# Patient Record
Sex: Female | Born: 2000 | Race: Black or African American | Hispanic: No | Marital: Single | Smoking: Never smoker
Health system: Southern US, Community
[De-identification: ages and names within clinical notes are randomized; demographics above are authoritative.]

## PROBLEM LIST (undated history)

## (undated) DIAGNOSIS — K5900 Constipation, unspecified: Secondary | ICD-10-CM

---
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
## 2019-08-31 ENCOUNTER — Encounter (HOSPITAL_BASED_OUTPATIENT_CLINIC_OR_DEPARTMENT_OTHER): Payer: Self-pay | Admitting: *Deleted

## 2019-08-31 ENCOUNTER — Emergency Department (HOSPITAL_BASED_OUTPATIENT_CLINIC_OR_DEPARTMENT_OTHER)
Admission: EM | Admit: 2019-08-31 | Discharge: 2019-08-31 | Disposition: A | Discharge status: Home / Self Care | Payer: Medicaid Other | Attending: Emergency Medicine | Admitting: Emergency Medicine

## 2019-08-31 ENCOUNTER — Other Ambulatory Visit: Payer: Self-pay

## 2019-08-31 DIAGNOSIS — N912 Amenorrhea, unspecified: Secondary | ICD-10-CM | POA: Insufficient documentation

## 2019-08-31 DIAGNOSIS — Z3201 Encounter for pregnancy test, result positive: Secondary | ICD-10-CM | POA: Diagnosis not present

## 2019-08-31 DIAGNOSIS — J45909 Unspecified asthma, uncomplicated: Secondary | ICD-10-CM | POA: Insufficient documentation

## 2019-08-31 DIAGNOSIS — Z3A01 Less than 8 weeks gestation of pregnancy: Secondary | ICD-10-CM | POA: Insufficient documentation

## 2019-08-31 DIAGNOSIS — Z3200 Encounter for pregnancy test, result unknown: Secondary | ICD-10-CM | POA: Diagnosis present

## 2019-08-31 LAB — PREGNANCY, URINE: Preg Test, Ur: POSITIVE — AB

## 2019-08-31 NOTE — ED Provider Notes (Signed)
[facility] EMERGENCY DEPARTMENT Provider Note   CSN: [id number] Arrival date & time: 08/31/19  1010     History Chief Complaint  Patient presents with  . Possible Pregnancy    Kari Mcguire is a 19 y.o. female with no relevant past medical history presents to the ED after having a positive at-home pregnancy test.  She reports that her period is 12 days late and she would like formal testing here in the ED.   Patient states that her last menses began 07/19/2019.  She endorses intermittent mild cramping, but she suspects that is attributed to "weight loss" tea that she had been drinking.  She recently discontinued that tea and states that her symptoms have improved.  She denies any fevers or chills, significant abdominal/pelvic pain, nausea or vomiting, vaginal bleeding or discharge, or other symptoms.  She is accompanied by her boyfriend.  She last saw her PCP at [facility] couple of months ago.  HPI     Past Medical History:  Diagnosis Date  . Asthma   . Constipation     There are no problems to display for this patient.   History reviewed. No pertinent surgical history.   OB History    Gravida  1   Para      Term      Preterm      AB      Living        SAB      TAB      Ectopic      Multiple      Live Births              No family history on file.  Social History   Tobacco Use  . Smoking status: Never Smoker  . Smokeless tobacco: Never Used  Substance Use Topics  . Alcohol use: No  . Drug use: No    Home Medications Prior to Admission medications   Medication Sig Start Date End Date Taking? Authorizing Provider  cephALEXin (KEFLEX) 500 MG capsule Take 1 capsule (500 mg total) by mouth 3 (three) times daily. 04/25/17   Horton, Mayer Masker, MD  mineral oil-hydrophilic petrolatum (AQUAPHOR) ointment Apply topically as needed. 04/25/17   Horton, Mayer Masker, MD    Allergies    Patient has no known allergies.  Review of  Systems   Review of Systems  Constitutional: Negative for fever.  Gastrointestinal: Negative for abdominal pain and vomiting.  Genitourinary: Positive for menstrual problem. Negative for dyspareunia, pelvic pain and vaginal bleeding.    Physical Exam Updated Vital Signs BP 102/78 (BP Location: Left Arm)   Pulse 95   Temp 99.6 F (37.6 C) (Oral)   Resp 18   Ht 5' (1.524 m)   Wt 79 kg   LMP 08/20/2019 (Approximate)   SpO2 100%   BMI 34.03 kg/m   Physical Exam Vitals and nursing note reviewed. Exam conducted with a chaperone present.  Constitutional:      General: She is not in acute distress.    Appearance: Normal appearance. She is not ill-appearing.  HENT:     Head: Normocephalic and atraumatic.  Eyes:     General: No scleral icterus.    Conjunctiva/sclera: Conjunctivae normal.  Cardiovascular:     Rate and Rhythm: Normal rate.     Pulses: Normal pulses.     Heart sounds: Normal heart sounds.  Pulmonary:     Effort: Pulmonary effort is normal. No respiratory distress.  Breath sounds: Normal breath sounds.  Abdominal:     Comments: Soft, nondistended.  No areas of TTP.  No guarding.  No overlying skin changes.  Musculoskeletal:     Cervical back: Normal range of motion.  Skin:    General: Skin is dry.     Capillary Refill: Capillary refill takes less than 2 seconds.  Neurological:     Mental Status: She is alert and oriented to person, place, and time.     GCS: GCS eye subscore is 4. GCS verbal subscore is 5. GCS motor subscore is 6.  Psychiatric:        Mood and Affect: Mood normal.        Behavior: Behavior normal.        Thought Content: Thought content normal.     ED Results / Procedures / Treatments   Labs (all labs ordered are listed, but only abnormal results are displayed) Labs Reviewed  PREGNANCY, URINE - Abnormal; Notable for the following components:      Result Value   Preg Test, Ur POSITIVE (*)    All other components within normal limits      EKG None  Radiology No results found.  Procedures Procedures (including critical care time)  Medications Ordered in ED Medications - No data to display  ED Course  I have reviewed the triage vital signs and the nursing notes.  Pertinent labs & imaging results that were available during my care of the patient were reviewed by me and considered in my medical decision making (see chart for details).    MDM Rules/Calculators/A&P                          Patient presents to the ED after having missed menses and positive at home pregnancy test.  She reports that she was instructed to come here by her mother who states that they required formal positive pregnancy testing.  She is accompanied by her boyfriend and they are not surprised by this diagnosis.  This is her first time being pregnant.  She denies any abdominal pain, fevers or chills, vaginal bleeding, or other concerning symptoms.  She is resting comfortably in no acute distress.  Patient understands that she needs to be taking prenatal vitamins and will need to avoid drinking and other substances that are potentially harmful to her fetus.  She plans to follow-up with her primary care provider regarding today's encounter and ultimately an OB/GYN.  All of the evaluation and work-up results were discussed with the patient and any family at bedside.  Patient and/or family were informed that while patient is appropriate for discharge at this time, some medical emergencies may only develop or become detectable after a period of time.  I specifically instructed patient and/or family to return to return to the ED or seek immediate medical attention for any new or worsening symptoms.  They were provided opportunity to ask any additional questions and have none at this time.  Prior to discharge patient is feeling well, agreeable with plan for discharge home.  They have expressed understanding of verbal discharge instructions as well as  return precautions and are agreeable to the plan.    Final Clinical Impression(s) / ED Diagnoses Final diagnoses:  Less than [redacted] weeks gestation of pregnancy    Rx / DC Orders ED Discharge Orders    None       Lorelee New, PA-C 08/31/19 1134    Plunkett,  Alphonzo Lemmings, MD 08/31/19 1447

## 2019-08-31 NOTE — Discharge Instructions (Signed)
Please read the attachment on prenatal care.  You will need to take prenatal vitamins (you can obtain these over-the-counter at pharmacy).  Please follow-up with your primary care provider regarding today's encounter.  I will also refer you to OB/GYN for you to schedule an appointment for ongoing evaluation and management of your pregnancy.  Return to the ED or seek immediate medical attention should you experience any new or worsening symptoms.

## 2019-08-31 NOTE — ED Triage Notes (Signed)
Want preg test  States period is 12 days late  Had pos home preg test this am

## 2023-04-13 NOTE — Progress Notes (Signed)
 Kari Mcguire is a 23 y.o. with  Active Ambulatory Problems    Diagnosis Date Noted  . No Active Ambulatory Problems   Resolved Ambulatory Problems    Diagnosis Date Noted  . No Resolved Ambulatory Problems   No Additional Past Medical History   who presents today at Urgent Care for  Chief Complaint  Patient presents with  . Nasal Congestion    Runny nose sneezing and cough, no fever reported. Sick last two days.      23 year old female seen in clinic for cough, runny nose, sneezing, nasal congestion, body aches, headache, sweats for the last 2 days.  She denies fever, shortness of breath, nausea, vomiting, and diarrhea.  Her son is also sick and his test just came back flu positive in clinic.     reports that she has never smoked. She has never used smokeless tobacco.  Review of Systems  Constitutional:  Positive for diaphoresis and fatigue. Negative for fever.  HENT:  Positive for congestion, sneezing and sore throat.   Respiratory:  Positive for cough.   Musculoskeletal:  Positive for myalgias.  Neurological:  Positive for headaches.  All other systems reviewed and are negative.   Physical Exam  BP 110/85 (BP Location: Right arm, Patient Position: Sitting)   Pulse 100   Temp 97 F (36.1 C) (Tympanic)   Resp 18   Wt 81.6 kg (180 lb)   SpO2 99%   Constitutional:      General: Patient is not in acute distress.    Appearance: Normal appearance.  Neurological:     General: No focal deficit present.     Mental Status: alert and oriented to person, place, and time.  HENT:     Eyes:     Conjunctiva/sclera: Conjunctivae normal. Pharynx cobblestoning and erythema Bilateral nares congested.    There is no  associated anterior cervical lymphadenopathy    Pupils: Pupils are equal, round, and reactive to light.     TM's normal with no visible effusion Cardiovascular:     Heart sounds: Normal heart sounds. No murmur heard.    No Lower extremity edema noted Pulmonary:      Effort: Pulmonary effort is normal. No respiratory distress.     Breath sounds: No wheezing, rhonchi or rales.  Skin:    General: Skin is warm and dry.  Psychiatric:        Mood and Affect: Mood normal.   No orders to display     Lab Results (last 24 hours)     Procedure Component Value Ref Range Date/Time   POC Influenza A&B NAT (IDNOW) [[id number]]  (Normal) Collected: 04/13/23 1846   Lab Status: Final result Specimen: Swab from Nasal Cavity Updated: 04/13/23 1847    Influenza A RNA Negative Negative     Influenza B RNA Negative Negative    POC SARS-COV-2 SYMPTOMATIC (IDNOW) [[id number]]  (Normal) Collected: 04/13/23 1842   Lab Status: Final result Specimen: Swab from Nasal Cavity Updated: 04/13/23 1842    SARS-COV-2 IDNOW Negative Negative     SARS IDNOW Information A rapid, molecular diagnostic test on the IDNOW.  Negative results should be treated as presumptive and, if inconsistent with clinical symptoms should be confirmed with an alternative molecular assay.          DIAGNOSIS/PLAN 1. Flu-like symptoms - POC Influenza A&B NAT (IDNOW) - POC SARS-COV-2 SYMPTOMATIC (IDNOW) - oseltamivir (TAMIFLU) 75 mg capsule; Take 1 capsule (75 mg total) by mouth 2 (two) times a day  for 5 days.  Dispense: 10 capsule; Refill: 0  2. Influenza A (Primary) - oseltamivir (TAMIFLU) 75 mg capsule; Take 1 capsule (75 mg total) by mouth 2 (two) times a day for 5 days.  Dispense: 10 capsule; Refill: 0        We discussed risks and side effects of medications, and also discussed red flags which would warrant immediate follow-up.      23 year old female seen in clinic for runny nose, sneezing, nasal congestion, body aches, headache, sweats for the last 2 days.  She denies fever, cough, shortness of breath, nausea, vomiting, and diarrhea.  Her son is also sick and his test just came back flu positive in clinic.  Patient's flu and COVID are negative however her son's flu a is positive so we  will treat her for the flu.  Treating with Tamiflu.  Also advised on symptom treatment.  Note provided to be out of work until Monday

## 2024-01-07 ENCOUNTER — Emergency Department (HOSPITAL_BASED_OUTPATIENT_CLINIC_OR_DEPARTMENT_OTHER)
Admission: EM | Admit: 2024-01-07 | Discharge: 2024-01-07 | Disposition: A | Discharge status: Home / Self Care | Payer: Self-pay | Attending: Emergency Medicine | Admitting: Emergency Medicine

## 2024-01-07 ENCOUNTER — Encounter (HOSPITAL_BASED_OUTPATIENT_CLINIC_OR_DEPARTMENT_OTHER): Payer: Self-pay

## 2024-01-07 ENCOUNTER — Other Ambulatory Visit: Payer: Self-pay

## 2024-01-07 DIAGNOSIS — J45909 Unspecified asthma, uncomplicated: Secondary | ICD-10-CM | POA: Insufficient documentation

## 2024-01-07 DIAGNOSIS — J069 Acute upper respiratory infection, unspecified: Secondary | ICD-10-CM | POA: Insufficient documentation

## 2024-01-07 LAB — RESP PANEL BY RT-PCR (RSV, FLU A&B, COVID)  RVPGX2
Influenza A by PCR: NEGATIVE
Influenza B by PCR: NEGATIVE
Resp Syncytial Virus by PCR: NEGATIVE
SARS Coronavirus 2 by RT PCR: NEGATIVE

## 2024-01-07 NOTE — Discharge Instructions (Addendum)
 Drink plenty of fluids.  Continue using over-the-counter cough and cold medication.

## 2024-01-07 NOTE — ED Provider Notes (Signed)
  [facility] Provider Note   CSN: [id number] Arrival date & time: 01/07/24  2206     Patient presents with: Cough   Kari Mcguire is a 23 y.o. female.   The history is provided by the patient.  Cough  She has a history of asthma and comes in with cough with this has been present for the last 5 days.  She initially had sore throat and pain in her right ear, but those have resolved.  She has had some mild nasal congestion.  Cough is nonproductive.  There has been no vomiting or diarrhea.  She denies fever, chills, sweats.  She denies arthralgias and myalgias.  She has been taking over-the-counter cough and cold medication, and symptoms are improving.  She was here with her son and states that she not been bringing her son in to be checked, she would not have come in to be checked.  Her son does have of a similar illness.    Prior to Admission medications   Medication Sig Start Date End Date Taking? Authorizing Provider  cephALEXin  (KEFLEX ) 500 MG capsule Take 1 capsule (500 mg total) by mouth 3 (three) times daily. 04/25/17   Horton, Charmaine FALCON, MD  mineral oil-hydrophilic petrolatum (AQUAPHOR) ointment Apply topically as needed. 04/25/17   Horton, Charmaine FALCON, MD    Allergies: Patient has no known allergies.    Review of Systems  Respiratory:  Positive for cough.   All other systems reviewed and are negative.   Updated Vital Signs BP 118/66 (BP Location: Right Arm)   Pulse 74   Temp 98.6 F (37 C) (Oral)   Resp 20   Ht 4' 11 (1.499 m)   Wt 81.6 kg   SpO2 100%   BMI 36.36 kg/m   Physical Exam Vitals and nursing note reviewed.   23 year old female, resting comfortably and in no acute distress. Vital signs are normal. Oxygen saturation is 100%, which is normal. Head is normocephalic and atraumatic. PERRLA, EOMI. Oropharynx is clear.  Tympanic membranes are clear. Neck is nontender and supple without adenopathy. Lungs are clear  without rales, wheezes, or rhonchi. Heart has regular rate and rhythm without murmur. Abdomen is soft, flat, nontender. Skin is warm and dry without rash. Neurologic: Mental status is normal, cranial nerves are intact, moves all extremities equally.  (all labs ordered are listed, but only abnormal results are displayed) Labs Reviewed  RESP PANEL BY RT-PCR (RSV, FLU A&B, COVID)  [id number]     Procedures   Medications Ordered in the ED - No data to display                                  Medical Decision Making  Cough which seems most likely to be due to a viral URI.  Doubt pneumonia.  At this point, patient is clinically improving, no indication for x-rays.  I reviewed her laboratory tests, my interpretation is negative PCR for COVID-19, influenza, RSV.  I am recommending patient continue symptomatic treatment.     Final diagnoses:  Viral URI with cough    ED Discharge Orders     None          Raford Lenis, MD 01/07/24 2349
